# Patient Record
Sex: Male | Born: 1990 | Hispanic: No | Marital: Single | Smoking: Never smoker
Health system: Southern US, Community
[De-identification: ages and names within clinical notes are randomized; demographics above are authoritative.]

## PROBLEM LIST (undated history)

## (undated) DIAGNOSIS — T7840XA Allergy, unspecified, initial encounter: Secondary | ICD-10-CM

## (undated) HISTORY — DX: Allergy, unspecified, initial encounter: T78.40XA

## (undated) HISTORY — PX: EYE SURGERY: SHX253

---
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
## 2015-10-09 ENCOUNTER — Ambulatory Visit (INDEPENDENT_AMBULATORY_CARE_PROVIDER_SITE_OTHER): Payer: BLUE CROSS/BLUE SHIELD | Admitting: Internal Medicine

## 2015-10-09 ENCOUNTER — Ambulatory Visit (INDEPENDENT_AMBULATORY_CARE_PROVIDER_SITE_OTHER): Payer: BLUE CROSS/BLUE SHIELD

## 2015-10-09 VITALS — BP 122/74 | HR 57 | Temp 98.6°F | Resp 18 | Ht 67.0 in | Wt 160.0 lb

## 2015-10-09 DIAGNOSIS — R937 Abnormal findings on diagnostic imaging of other parts of musculoskeletal system: Secondary | ICD-10-CM

## 2015-10-09 DIAGNOSIS — M546 Pain in thoracic spine: Secondary | ICD-10-CM

## 2015-10-09 MED ORDER — CYCLOBENZAPRINE HCL 10 MG PO TABS: 10.0000 mg | ORAL_TABLET | Freq: Every day | ORAL | Status: DC

## 2015-10-09 MED ORDER — DICLOFENAC SODIUM 75 MG PO TBEC: 75.0000 mg | DELAYED_RELEASE_TABLET | Freq: Two times a day (BID) | ORAL | Status: DC

## 2015-10-09 NOTE — Progress Notes (Signed)
By signing my name below, I, Mesha Guinyard, attest that this documentation has been prepared under the direction and in the presence of Ellamae Siaobert Myrtis Maille, MD.  Electronically Signed: Arvilla MarketMesha Guinyard, Medical Scribe. 10/09/2015. 2:32 PM.  Subjective:    Patient ID: Calvin Hernandez, male    DOB: 08/08/1990, 25 y.o.   MRN: [id number]  HPI Chief Complaint  Patient presents with  . Back Pain    x 2 wks pulled a muscle in back and neck a year ago while working, sxs got better, but with certain movements spasms return   . Neck Pain    x 2 wks   HPI Comments: Calvin Freshwaterndres File is a 25 y.o. male who presents to the [facility] complaining of upper back pain that began a year ago while working at [employer]. Pt was lifting packages while moving side to side while in a rush, and when he jerked, the pain occured. The initial pain was describes as intense, but he worked throught it when it occured a year ago. 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 Pt mentions feeling the pain in his back in the thoracic and cervial region while playing basketball this weekend, bench pressing today, when he sits up and extending his head forward. Pt states his arm is asleep when he wakes up depending on how he sleeps. Pt denies numbess, or discomfort while taking a deep breath.  There are no active problems to display for this patient.  No current outpatient prescriptions on file. Allergies  Allergen Reactions  . Shellfish Allergy    Review of Systems  Musculoskeletal: Positive for back pain (thoracic and cervial region) and arthralgias (tender deep in the right thoracic paraspinal mucles).    Objective:   Physical Exam  Constitutional: He is oriented to person, place, and time. He appears well-developed and well-nourished. No distress.  HENT:  Head: Normocephalic and atraumatic.  Eyes: Pupils  are equal, round, and reactive to light.  Neck: Normal range of motion.  Cardiovascular: Normal rate and regular rhythm.   Pulmonary/Chest: Effort normal. No respiratory distress.  Musculoskeletal: Normal range of motion.  Tender deep in the right thoracic paraspinal mucles with pain exacerbated by forward head flexion and by tilting to the right but with good ROM and with no redicular symptoms to the arm or the shoulder or the scapula and with no tenderness in the trapezius  Neurological: He is alert and oriented to person, place, and time.  No sensory or motor loss in the upper extremities  Skin: Skin is warm and dry.  Psychiatric: He has a normal mood and affect. His behavior is normal.  Nursing note and vitals reviewed.  Filed Vitals:   10/09/15 1336  BP: 122/74  Pulse: 57  Temp: 98.6 F (37 C)  TempSrc: Oral  Resp: 18  Height: 5\' 7"  (1.702 m)  Weight: 160 lb (72.576 kg)  SpO2: 99%   Dg Thoracic Spine 2 View  10/09/2015  CLINICAL DATA:  Pain at T3-T4 for 1 year on RIGHT EXAM: THORACIC SPINE 2 VIEWS COMPARISON:  None. FINDINGS: 12 pairs of ribs. Osseous mineralization normal. Vertebral body and disc space heights appear grossly maintained. Questionable sclerosis at the superior T4 vertebral body anteriorly. Visualized posterior ribs unremarkable. IMPRESSION: Question sclerosis versus superimposed summation artifact at the superior endplate of T4 anteriorly, unable to exclude T4 pathology;  recommend followup MR imaging thoracic spine to further evaluate. Electronically Signed   By: Ulyses Southward M.D.   On: 10/09/2015 14:58   Assessment & Plan:    I have completed the patient encounter in its entirety as documented by the scribe, with editing by me where necessary. Astra Gregg P. Merla Riches, M.D. Right-sided thoracic back pain - due to injury 1 year ago?? With consistent reinjury  Abnormal x-ray of thoracic spine - Plan: MR Thoracic Spine Wo Contrast needed  Meds ordered this encounter    Medications  . diclofenac (VOLTAREN) 75 MG EC tablet    Sig: Take 1 tablet (75 mg total) by mouth 2 (two) times daily.    Dispense:  30 tablet    Refill:  0  . cyclobenzaprine (FLEXERIL) 10 MG tablet    Sig: Take 1 tablet (10 mg total) by mouth at bedtime.    Dispense:  10 tablet    Refill:  0

## 2015-10-16 ENCOUNTER — Other Ambulatory Visit: Payer: Self-pay

## 2015-11-14 ENCOUNTER — Ambulatory Visit (INDEPENDENT_AMBULATORY_CARE_PROVIDER_SITE_OTHER): Payer: BLUE CROSS/BLUE SHIELD | Admitting: Physician Assistant

## 2015-11-14 VITALS — BP 120/78 | HR 70 | Temp 98.7°F | Resp 16 | Ht 67.0 in | Wt 155.8 lb

## 2015-11-14 DIAGNOSIS — L819 Disorder of pigmentation, unspecified: Secondary | ICD-10-CM | POA: Diagnosis not present

## 2015-11-14 DIAGNOSIS — Z113 Encounter for screening for infections with a predominantly sexual mode of transmission: Secondary | ICD-10-CM

## 2015-11-14 LAB — POCT SKIN KOH: SKIN KOH, POC: NEGATIVE

## 2015-11-14 MED ORDER — CLOTRIMAZOLE-BETAMETHASONE 1-0.05 % EX CREA: 1.0000 "application " | TOPICAL_CREAM | Freq: Two times a day (BID) | CUTANEOUS | Status: DC
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
## 2015-11-14 NOTE — Patient Instructions (Signed)
     IF you received an x-ray today, you will receive an invoice from [facility]. Please contact [facility] at [phone] with questions or concerns regarding your invoice.   IF you received labwork today, you will receive an invoice from [facility]. Please contact [facility] at [phone] with questions or concerns regarding your invoice.   Our billing staff will not be able to assist you with questions regarding bills from these companies.  You will be contacted with the lab results as soon as they are available. The fastest way to get your results is to activate your My Chart account. Instructions are located on the last page of this paperwork. If you have not heard from us regarding the results in 2 weeks, please contact this office.      

## 2015-11-15 ENCOUNTER — Telehealth: Payer: Self-pay | Admitting: Emergency Medicine

## 2015-11-15 LAB — GC/CHLAMYDIA PROBE AMP
CT PROBE, AMP APTIMA: NOT DETECTED
GC Probe RNA: NOT DETECTED

## 2015-11-15 NOTE — Telephone Encounter (Signed)
Left message with negative test results

## 2015-11-15 NOTE — Telephone Encounter (Signed)
-----   Message from Michael L Clark, PA-C sent at 11/15/2015  3:02 PM EDT ----- Please make patient aware of results via letter or phone call.  Michael Clark PA-C, 11/15/2015 3:02 PM 

## 2015-11-18 ENCOUNTER — Encounter: Payer: Self-pay | Admitting: *Deleted

## 2015-12-29 ENCOUNTER — Other Ambulatory Visit (INDEPENDENT_AMBULATORY_CARE_PROVIDER_SITE_OTHER): Payer: BLUE CROSS/BLUE SHIELD | Admitting: Physician Assistant

## 2015-12-29 ENCOUNTER — Encounter: Payer: Self-pay | Admitting: Emergency Medicine

## 2015-12-29 DIAGNOSIS — Z113 Encounter for screening for infections with a predominantly sexual mode of transmission: Secondary | ICD-10-CM | POA: Diagnosis not present

## 2015-12-29 LAB — HIV ANTIBODY (ROUTINE TESTING W REFLEX): HIV: NONREACTIVE

## 2015-12-30 LAB — RPR

## 2016-05-09 ENCOUNTER — Ambulatory Visit (INDEPENDENT_AMBULATORY_CARE_PROVIDER_SITE_OTHER): Payer: BLUE CROSS/BLUE SHIELD | Admitting: Physician Assistant

## 2016-05-09 VITALS — BP 112/72 | HR 55 | Temp 98.6°F | Resp 17 | Ht 67.0 in | Wt 158.0 lb

## 2016-05-09 DIAGNOSIS — Z113 Encounter for screening for infections with a predominantly sexual mode of transmission: Secondary | ICD-10-CM | POA: Diagnosis not present

## 2016-05-09 DIAGNOSIS — Z1389 Encounter for screening for other disorder: Secondary | ICD-10-CM

## 2016-05-09 DIAGNOSIS — Z1329 Encounter for screening for other suspected endocrine disorder: Secondary | ICD-10-CM | POA: Diagnosis not present

## 2016-05-09 DIAGNOSIS — Z114 Encounter for screening for human immunodeficiency virus [HIV]: Secondary | ICD-10-CM | POA: Diagnosis not present

## 2016-05-09 DIAGNOSIS — R0789 Other chest pain: Secondary | ICD-10-CM

## 2016-05-09 DIAGNOSIS — Z136 Encounter for screening for cardiovascular disorders: Secondary | ICD-10-CM

## 2016-05-09 DIAGNOSIS — Z130 Encounter for screening for diseases of the blood and blood-forming organs and certain disorders involving the immune mechanism: Secondary | ICD-10-CM

## 2016-05-09 DIAGNOSIS — Z131 Encounter for screening for diabetes mellitus: Secondary | ICD-10-CM | POA: Diagnosis not present

## 2016-05-09 DIAGNOSIS — Z13220 Encounter for screening for lipoid disorders: Secondary | ICD-10-CM | POA: Diagnosis not present

## 2016-05-09 DIAGNOSIS — Z0000 Encounter for general adult medical examination without abnormal findings: Secondary | ICD-10-CM | POA: Diagnosis not present

## 2016-05-09 DIAGNOSIS — Z23 Encounter for immunization: Secondary | ICD-10-CM | POA: Diagnosis not present

## 2016-05-09 NOTE — Patient Instructions (Signed)
     IF you received an x-ray today, you will receive an invoice from [facility]. Please contact [facility] at [phone] with questions or concerns regarding your invoice.   IF you received labwork today, you will receive an invoice from [facility]. Please contact [facility] at [phone] with questions or concerns regarding your invoice.   Our billing staff will not be able to assist you with questions regarding bills from these companies.  You will be contacted with the lab results as soon as they are available. The fastest way to get your results is to activate your My Chart account. Instructions are located on the last page of this paperwork. If you have not heard from us regarding the results in 2 weeks, please contact this office.      
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
## 2016-05-11 LAB — CMP14+EGFR
A/G RATIO: 2.1 (ref 1.2–2.2)
ALBUMIN: 4.7 g/dL (ref 3.5–5.5)
ALK PHOS: 56 IU/L (ref 39–117)
ALT: 20 IU/L (ref 0–44)
AST: 21 IU/L (ref 0–40)
BILIRUBIN TOTAL: 0.3 mg/dL (ref 0.0–1.2)
BUN / CREAT RATIO: 13 (ref 9–20)
BUN: 12 mg/dL (ref 6–20)
CHLORIDE: 100 mmol/L (ref 96–106)
CO2: 26 mmol/L (ref 18–29)
Calcium: 9.5 mg/dL (ref 8.7–10.2)
Creatinine, Ser: 0.96 mg/dL (ref 0.76–1.27)
GFR calc non Af Amer: 109 mL/min/{1.73_m2} (ref >59)
GFR, EST AFRICAN AMERICAN: 126 mL/min/{1.73_m2} (ref >59)
GLOBULIN, TOTAL: 2.2 g/dL (ref 1.5–4.5)
Glucose: 97 mg/dL (ref 65–99)
Potassium: 4.4 mmol/L (ref 3.5–5.2)
SODIUM: 141 mmol/L (ref 134–144)
TOTAL PROTEIN: 6.9 g/dL (ref 6.0–8.5)

## 2016-05-11 LAB — TSH: TSH: 1.48 u[IU]/mL (ref 0.450–4.500)

## 2016-05-11 LAB — CBC
HEMATOCRIT: 46.1 % (ref 37.5–51.0)
HEMOGLOBIN: 15.1 g/dL (ref 13.0–17.7)
MCH: 31.3 pg (ref 26.6–33.0)
MCHC: 32.8 g/dL (ref 31.5–35.7)
MCV: 95 fL (ref 79–97)
Platelets: 277 10*3/uL (ref 150–379)
RBC: 4.83 x10E6/uL (ref 4.14–5.80)
RDW: 13.5 % (ref 12.3–15.4)
WBC: 5.1 10*3/uL (ref 3.4–10.8)

## 2016-05-11 LAB — HEMOGLOBIN A1C
ESTIMATED AVERAGE GLUCOSE: 117 mg/dL
HEMOGLOBIN A1C: 5.7 % — AB (ref 4.8–5.6)

## 2016-05-11 LAB — GC/CHLAMYDIA PROBE AMP
Chlamydia trachomatis, NAA: NEGATIVE
Neisseria gonorrhoeae by PCR: NEGATIVE

## 2016-05-11 LAB — LIPID PANEL
CHOLESTEROL TOTAL: 186 mg/dL (ref 100–199)
Chol/HDL Ratio: 3.5 ratio units (ref 0.0–5.0)
HDL: 53 mg/dL (ref >39)
LDL Calculated: 113 mg/dL — ABNORMAL HIGH (ref 0–99)
Triglycerides: 100 mg/dL (ref 0–149)
VLDL CHOLESTEROL CAL: 20 mg/dL (ref 5–40)

## 2016-05-11 LAB — HIV ANTIBODY (ROUTINE TESTING W REFLEX): HIV Screen 4th Generation wRfx: NONREACTIVE

## 2016-06-06 ENCOUNTER — Ambulatory Visit (INDEPENDENT_AMBULATORY_CARE_PROVIDER_SITE_OTHER): Payer: BLUE CROSS/BLUE SHIELD | Admitting: Physician Assistant

## 2016-06-06 VITALS — BP 126/80 | HR 80 | Temp 99.4°F | Resp 16 | Ht 67.0 in | Wt 154.8 lb

## 2016-06-06 DIAGNOSIS — R300 Dysuria: Secondary | ICD-10-CM | POA: Diagnosis not present

## 2016-06-06 LAB — POC MICROSCOPIC URINALYSIS (UMFC): Mucus: ABSENT

## 2016-06-06 LAB — POCT URINALYSIS DIP (MANUAL ENTRY)
BILIRUBIN UA: NEGATIVE
Glucose, UA: NEGATIVE
Ketones, POC UA: NEGATIVE
Leukocytes, UA: NEGATIVE
NITRITE UA: NEGATIVE
PH UA: 6
Protein Ur, POC: NEGATIVE
Spec Grav, UA: >=1.03
Urobilinogen, UA: 0.2
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
## 2016-06-06 NOTE — Patient Instructions (Signed)
     IF you received an x-ray today, you will receive an invoice from [facility]. Please contact [facility] at [phone] with questions or concerns regarding your invoice.   IF you received labwork today, you will receive an invoice from [facility]. Please contact [facility] at [phone] with questions or concerns regarding your invoice.   Our billing staff will not be able to assist you with questions regarding bills from these companies.  You will be contacted with the lab results as soon as they are available. The fastest way to get your results is to activate your My Chart account. Instructions are located on the last page of this paperwork. If you have not heard from us regarding the results in 2 weeks, please contact this office.     

## 2016-06-07 LAB — HSV(HERPES SIMPLEX VRS) I + II AB-IGG: HSV 1 Glycoprotein G Ab, IgG: 46.6 index — ABNORMAL HIGH (ref 0.00–0.90)

## 2016-06-08 LAB — GC/CHLAMYDIA PROBE AMP
CHLAMYDIA, DNA PROBE: NEGATIVE
NEISSERIA GONORRHOEAE BY PCR: NEGATIVE

## 2016-06-08 LAB — GENITAL MYCOPLASMAS NAA, URINE
MYCOPLASMA HOMINIS NAA: NEGATIVE
Mycoplasma genitalium NAA: NEGATIVE
UREAPLASMA SPP NAA: NEGATIVE

## 2016-06-09 ENCOUNTER — Encounter: Payer: Self-pay | Admitting: Physician Assistant

## 2016-06-11 LAB — TRICHOMONAS VAGINALIS, PROBE AMP: TRICH VAG BY NAA: NEGATIVE

## 2016-11-20 ENCOUNTER — Encounter: Payer: Self-pay | Admitting: Physician Assistant

## 2016-11-20 ENCOUNTER — Ambulatory Visit (INDEPENDENT_AMBULATORY_CARE_PROVIDER_SITE_OTHER): Payer: BLUE CROSS/BLUE SHIELD | Admitting: Physician Assistant

## 2016-11-20 VITALS — BP 97/62 | HR 57 | Temp 98.4°F | Resp 16 | Ht 67.0 in | Wt 157.4 lb

## 2016-11-20 DIAGNOSIS — B354 Tinea corporis: Secondary | ICD-10-CM

## 2016-11-20 MED ORDER — FLUCONAZOLE 150 MG PO TABS: 150.0000 mg | ORAL_TABLET | ORAL | 0 refills | Status: DC

## 2016-11-20 NOTE — Progress Notes (Signed)
    11/20/2016 2:06 PM   DOB: 10/27/1990 / MRN: [id number]  SUBJECTIVE:  Calvin Hernandez is a 26 y.o. male presenting for rash times 2 months.  This is primarily on his stomach but he has this on his his legs as well. He drinks occasionally and had a beer yesterday.  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   He  has a past medical history of Allergy.    He  reports that he has been smoking E-cigarettes.  He has never used smokeless tobacco. He reports that he drinks alcohol. He reports that he uses drugs, including Marijuana. He  has no sexual activity history on file. The patient  has a past surgical history that includes Eye surgery.  His family history is not on file.  Review of Systems  Constitutional: Negative for chills and fever.  HENT: Negative for sore throat.   Cardiovascular: Negative for chest pain.  Gastrointestinal: Negative for nausea.  Skin: Positive for itching and rash.    The problem list and medications were reviewed and updated by myself where necessary and exist elsewhere in the encounter.   OBJECTIVE:  BP 97/62 (BP Location: Right Arm, Patient Position: Sitting, Cuff Size: Normal)   Pulse (!) 57   Temp 98.4 F (36.9 C) (Oral)   Resp 16   Ht 5\' 7"  (1.702 m)   Wt 157 lb 6.4 oz (71.4 kg)   SpO2 98%   BMI 24.65 kg/m   Physical Exam  Constitutional: He is oriented to person, place, and time. He appears well-developed. He is active and cooperative.  Non-toxic appearance.  Cardiovascular: Normal rate.   Pulmonary/Chest: Effort normal. No tachypnea.  Neurological: He is alert and oriented to person, place, and time. He has normal reflexes.  Skin: Skin is warm and dry. Rash noted. He is not diaphoretic. No pallor.     Vitals reviewed.   No results found for this or any previous visit (from the past 72 hour(s)).  No results found.  ASSESSMENT AND PLAN:  Calvin Hernandez was seen today for rash.  Diagnoses and all orders for this visit:  Tinea corporis -      fluconazole (DIFLUCAN) 150 MG tablet; Take 1 tablet (150 mg total) by mouth once a week.    The patient is advised to call or return to clinic if he does not see an improvement in symptoms, or to seek the care of the closest emergency department if he worsens with the above plan.   Calvin BostonMichael Meelah Hernandez, MHS, PA-C [facility] 11/20/2016 2:06 PM

## 2016-11-20 NOTE — Patient Instructions (Addendum)
  For any other rash try Lotramin ultra.    IF you received an x-ray today, you will receive an invoice from [facility]. Please contact [facility] at [phone] with questions or concerns regarding your invoice.   IF you received labwork today, you will receive an invoice from [facility]. Please contact [facility] at [phone] with questions or concerns regarding your invoice.   Our billing staff will not be able to assist you with questions regarding bills from these companies.  You will be contacted with the lab results as soon as they are available. The fastest way to get your results is to activate your My Chart account. Instructions are located on the last page of this paperwork. If you have not heard from us regarding the results in 2 weeks, please contact this office.

## 2017-03-22 ENCOUNTER — Ambulatory Visit (INDEPENDENT_AMBULATORY_CARE_PROVIDER_SITE_OTHER): Payer: BLUE CROSS/BLUE SHIELD | Admitting: Physician Assistant

## 2017-03-22 ENCOUNTER — Encounter: Payer: Self-pay | Admitting: Physician Assistant

## 2017-03-22 VITALS — BP 100/60 | HR 53 | Temp 98.6°F | Resp 18 | Ht 67.0 in | Wt 151.8 lb

## 2017-03-22 DIAGNOSIS — Z13228 Encounter for screening for other metabolic disorders: Secondary | ICD-10-CM

## 2017-03-22 DIAGNOSIS — Z130 Encounter for screening for diseases of the blood and blood-forming organs and certain disorders involving the immune mechanism: Secondary | ICD-10-CM

## 2017-03-22 DIAGNOSIS — Z1321 Encounter for screening for nutritional disorder: Secondary | ICD-10-CM | POA: Diagnosis not present

## 2017-03-22 DIAGNOSIS — Z1329 Encounter for screening for other suspected endocrine disorder: Secondary | ICD-10-CM

## 2017-03-22 DIAGNOSIS — Z0000 Encounter for general adult medical examination without abnormal findings: Secondary | ICD-10-CM

## 2017-03-22 DIAGNOSIS — Z113 Encounter for screening for infections with a predominantly sexual mode of transmission: Secondary | ICD-10-CM | POA: Diagnosis not present

## 2017-03-22 NOTE — Addendum Note (Signed)
Addended by: Ofilia NeasLARK, Aubreyana Saltz L on: 03/22/2017 09:41 AM   Modules accepted: Orders

## 2017-03-22 NOTE — Progress Notes (Signed)
    03/22/2017 9:28 AM   DOB: 02/14/1991 / MRN: [id number]  SUBJECTIVE:  Calvin Hernandez is a 26 y.o. male presenting for annual exam. He is exceedingly healthy. Exercises daily.   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   He  has a past medical history of Allergy.    He  reports that he has never smoked. He has never used smokeless tobacco. He reports that he drinks alcohol. He reports that he uses drugs, including Marijuana. He  reports that he currently engages in sexual activity and has had male partners. He reports using the following method of birth control/protection: None. The patient  has a past surgical history that includes Eye surgery.  His family history includes Diabetes Mellitus II in his father and paternal aunt; Hyperlipidemia in his brother; Hypertension in his mother.  Review of Systems  Constitutional: Negative for chills, diaphoresis and fever.  Respiratory: Negative for cough, hemoptysis, sputum production, shortness of breath and wheezing.   Cardiovascular: Negative for chest pain.  Gastrointestinal: Negative for nausea.  Skin: Negative for rash.  Neurological: Negative for dizziness.    The problem list and medications were reviewed and updated by myself where necessary and exist elsewhere in the encounter.   OBJECTIVE:  BP 100/60 (BP Location: Left Arm, Patient Position: Sitting, Cuff Size: Normal)   Pulse (!) 53   Temp 98.6 F (37 C) (Oral)   Resp 18   Ht 5\' 7"  (1.702 m)   Wt 151 lb 12.8 oz (68.9 kg)   SpO2 99%   BMI 23.78 kg/m   Physical Exam  Constitutional: He appears well-developed. He is active and cooperative.  Non-toxic appearance.  Cardiovascular: Normal rate, regular rhythm, S1 normal, S2 normal, normal heart sounds, intact distal pulses and normal pulses.  Exam reveals no gallop and no friction rub.   No murmur heard. Pulmonary/Chest: Effort normal. No stridor. No tachypnea. No respiratory distress. He has no wheezes. He has no rales.    Abdominal: He exhibits no distension.  Musculoskeletal: He exhibits no edema.  Neurological: He is alert.  Skin: Skin is warm and dry. He is not diaphoretic. No pallor.  Vitals reviewed.   No results found for this or any previous visit (from the past 72 hour(s)).  No results found.  ASSESSMENT AND PLAN:  Calvin Ginsndres was seen today for labs and follow-up.  Diagnoses and all orders for this visit:  Annual physical exam: Exceedingly healthy.   Screening examination for STD (sexually transmitted disease) -     HIV antibody -     RPR -     GC/Chlamydia Probe Amp  Screening for endocrine, nutritional, metabolic and immunity disorder -     Lipid Panel -     CBC -     TSH -     Hemoglobin A1c -     Vitamin D, 25-hydroxy -     Vitamin B12    The patient is advised to call or return to clinic if he does not see an improvement in symptoms, or to seek the care of the closest emergency department if he worsens with the above plan.   Deliah BostonMichael Clark, MHS, PA-C [facility] 03/22/2017 9:28 AM

## 2017-03-22 NOTE — Patient Instructions (Addendum)
     IF you received an x-ray today, you will receive an invoice from [facility]. Please contact [facility] at [phone] with questions or concerns regarding your invoice.   IF you received labwork today, you will receive an invoice from [facility]. Please contact [facility] at [phone] with questions or concerns regarding your invoice.   Our billing staff will not be able to assist you with questions regarding bills from these companies.  You will be contacted with the lab results as soon as they are available. The fastest way to get your results is to activate your My Chart account. Instructions are located on the last page of this paperwork. If you have not heard from us regarding the results in 2 weeks, please contact this office.     

## 2017-03-24 ENCOUNTER — Other Ambulatory Visit: Payer: Self-pay | Admitting: Physician Assistant

## 2017-03-24 LAB — CBC
Hematocrit: 42.8 % (ref 37.5–51.0)
Hemoglobin: 14.4 g/dL (ref 13.0–17.7)
MCH: 32.3 pg (ref 26.6–33.0)
MCHC: 33.6 g/dL (ref 31.5–35.7)
MCV: 96 fL (ref 79–97)
Platelets: 260 10*3/uL (ref 150–379)
RBC: 4.46 x10E6/uL (ref 4.14–5.80)
RDW: 13.3 % (ref 12.3–15.4)
WBC: 3.1 10*3/uL — ABNORMAL LOW (ref 3.4–10.8)

## 2017-03-24 LAB — LIPID PANEL
Chol/HDL Ratio: 2.2 ratio (ref 0.0–5.0)
Cholesterol, Total: 107 mg/dL (ref 100–199)
HDL: 49 mg/dL
LDL Calculated: 47 mg/dL (ref 0–99)
Triglycerides: 55 mg/dL (ref 0–149)
VLDL Cholesterol Cal: 11 mg/dL (ref 5–40)

## 2017-03-24 LAB — VITAMIN D 25 HYDROXY (VIT D DEFICIENCY, FRACTURES): VIT D 25 HYDROXY: 26.9 ng/mL — AB (ref 30.0–100.0)

## 2017-03-24 LAB — VITAMIN B12: Vitamin B-12: 248 pg/mL (ref 232–1245)

## 2017-03-24 LAB — TSH: TSH: 1.21 u[IU]/mL (ref 0.450–4.500)

## 2017-03-24 LAB — HIV ANTIBODY (ROUTINE TESTING W REFLEX): HIV SCREEN 4TH GENERATION: NONREACTIVE

## 2017-03-24 LAB — SYPHILIS: RPR W/REFLEX TO RPR TITER AND TREPONEMAL ANTIBODIES, TRADITIONAL SCREENING AND DIAGNOSIS ALGORITHM: RPR Ser Ql: NONREACTIVE

## 2017-03-24 LAB — HEMOGLOBIN A1C
Est. average glucose Bld gHb Est-mCnc: 108 mg/dL
Hgb A1c MFr Bld: 5.4 % (ref 4.8–5.6)

## 2017-03-24 NOTE — Addendum Note (Signed)
Addended by: Baldwin CrownJOHNSON, Eriq Hufford D on: 03/24/2017 09:05 AM   Modules accepted: Orders

## 2017-03-24 NOTE — Progress Notes (Signed)
Can we get a peripheral smear added? If not please call and arrange for pt to come in or go to lab core for that order.

## 2017-03-25 LAB — GC/CHLAMYDIA PROBE AMP
CHLAMYDIA, DNA PROBE: NEGATIVE
Neisseria gonorrhoeae by PCR: NEGATIVE

## 2017-03-30 LAB — PATHOLOGIST SMEAR REVIEW
BASOS ABS: 0 10*3/uL (ref 0.0–0.2)
Basos: 0 %
EOS (ABSOLUTE): 0.2 10*3/uL (ref 0.0–0.4)
Eos: 6 %
HEMATOCRIT: 43.6 % (ref 37.5–51.0)
HEMOGLOBIN: 14.3 g/dL (ref 13.0–17.7)
Immature Grans (Abs): 0 10*3/uL (ref 0.0–0.1)
Immature Granulocytes: 0 %
LYMPHS ABS: 1.6 10*3/uL (ref 0.7–3.1)
Lymphs: 51 %
MCH: 32.6 pg (ref 26.6–33.0)
MCHC: 32.8 g/dL (ref 31.5–35.7)
MCV: 99 fL — ABNORMAL HIGH (ref 79–97)
MONOCYTES: 9 %
MONOS ABS: 0.3 10*3/uL (ref 0.1–0.9)
NEUTROS ABS: 1.1 10*3/uL — AB (ref 1.4–7.0)
Neutrophils: 34 %
PLATELETS: 275 10*3/uL (ref 150–379)
Path Rev PLTs: NORMAL
Path Rev RBC: NORMAL
RBC: 4.39 x10E6/uL (ref 4.14–5.80)
RDW: 13.3 % (ref 12.3–15.4)
WBC: 3.1 10*3/uL — ABNORMAL LOW (ref 3.4–10.8)

## 2017-03-31 ENCOUNTER — Other Ambulatory Visit: Payer: Self-pay | Admitting: Physician Assistant

## 2017-03-31 DIAGNOSIS — D709 Neutropenia, unspecified: Secondary | ICD-10-CM

## 2017-04-01 ENCOUNTER — Telehealth: Payer: Self-pay | Admitting: Physician Assistant

## 2017-04-01 NOTE — Telephone Encounter (Signed)
That's correct. Thanks Pattricia BossAnnie.

## 2017-04-01 NOTE — Telephone Encounter (Signed)
Pt has MRI ordered from 03/22/17. The note from this day does not mention pt's back pain/ abnormal xray. The last mention of pt's back looks to be from visit on 10/09/15 with Dr. Merla Richesoolittle. Is this MRI order based on the notes from that visit? Thanks!

## 2017-04-23 NOTE — Telephone Encounter (Signed)
Pt MRI has not been approved by [employer]. It is currently under review due to not meeting medical necessity with [employer], and the case is to close on 04/29/17. Once this case has been closed, a peer to peer may be done if prior Berkley Harveyauth is denied by calling [employer] at [phone]. Thanks.

## 2017-05-30 ENCOUNTER — Telehealth: Payer: Self-pay | Admitting: Physician Assistant

## 2017-05-30 NOTE — Telephone Encounter (Signed)
Pt's MRI was not approved by [employer]. If procedure is still desired, peer to peer can be done by calling [employer] at [phone].

## 2019-06-12 ENCOUNTER — Other Ambulatory Visit: Payer: Self-pay

## 2019-06-12 DIAGNOSIS — Z20822 Contact with and (suspected) exposure to covid-19: Secondary | ICD-10-CM

## 2019-06-13 LAB — NOVEL CORONAVIRUS, NAA: SARS-CoV-2, NAA: NOT DETECTED

## 2020-08-04 ENCOUNTER — Encounter: Payer: Self-pay | Admitting: Podiatry

## 2020-08-04 ENCOUNTER — Other Ambulatory Visit: Payer: Self-pay

## 2020-08-04 ENCOUNTER — Ambulatory Visit (INDEPENDENT_AMBULATORY_CARE_PROVIDER_SITE_OTHER): Payer: BC Managed Care – PPO

## 2020-08-04 ENCOUNTER — Ambulatory Visit (INDEPENDENT_AMBULATORY_CARE_PROVIDER_SITE_OTHER): Payer: BC Managed Care – PPO | Admitting: Podiatry

## 2020-08-04 DIAGNOSIS — M779 Enthesopathy, unspecified: Secondary | ICD-10-CM

## 2020-08-04 DIAGNOSIS — M79674 Pain in right toe(s): Secondary | ICD-10-CM

## 2020-08-04 MED ORDER — TRIAMCINOLONE ACETONIDE 10 MG/ML IJ SUSP
10.0000 mg | Freq: Once | INTRAMUSCULAR | Status: AC
  Administered 2020-08-04: 10 mg

## 2020-08-07 NOTE — Progress Notes (Signed)
Subjective:   Patient ID: Calvin Hernandez, male   DOB: 30 y.o.   MRN: [id number]   HPI Patient presents concerned about discomfort he has developed underneath his right big toe stating he thinks he injured it a number of years ago and it remained persistently aggravated.  States that it is localized and it is not severe but he is aware of it and likes to exercise.  Patient does not smoke   Review of Systems  All other systems reviewed and are negative.       Objective:  Physical Exam Vitals and nursing note reviewed.  Constitutional:      Appearance: He is well-developed and well-nourished.  Cardiovascular:     Pulses: Intact distal pulses.  Pulmonary:     Effort: Pulmonary effort is normal.  Musculoskeletal:        General: Normal range of motion.  Skin:    General: Skin is warm.  Neurological:     Mental Status: He is alert.     Neurovascular status intact muscle strength adequate range of motion adequate.  Patient is found to have inflammation around the inner phalangeal joint right big toe and slightly proximal with no indications of tendon tear normal flexor function and what appears to be possibly a small palpable cyst within that area.  It is localized with no other pathology.  Patient has good digital perfusion well oriented x3     Assessment:  Possibility for inflammatory condition with possible cyst or other pathology or possible tendinitis     Plan:  H&P x-rays reviewed today.  Today I went ahead did sterile prep and injected the plantar tendon 3 mg dexamethasone Kenalog 5 mg Xylocaine advised on rigid bottom shoes anti-inflammatories and if no improvement will have to consider surgical intervention  X-rays indicate that there is no indication of bone injury appears to be soft tissue
